# Patient Record
Sex: Female | Born: 1965 | Race: White | Hispanic: No | Marital: Married | Smoking: Current some day smoker
Health system: Southern US, Community
[De-identification: ages and names within clinical notes are randomized; demographics above are authoritative.]

---

## 1998-03-10 ENCOUNTER — Other Ambulatory Visit: Admission: RE | Admit: 1998-03-10 | Discharge: 1998-03-10 | Payer: Self-pay | Admitting: Obstetrics and Gynecology

## 2001-01-18 ENCOUNTER — Ambulatory Visit (HOSPITAL_COMMUNITY): Admission: RE | Admit: 2001-01-18 | Discharge: 2001-01-18 | Payer: Self-pay | Admitting: Obstetrics and Gynecology

## 2001-01-18 ENCOUNTER — Encounter: Payer: Self-pay | Admitting: Obstetrics and Gynecology

## 2001-06-13 ENCOUNTER — Inpatient Hospital Stay (HOSPITAL_COMMUNITY): Admission: AD | Admit: 2001-06-13 | Discharge: 2001-06-16 | Payer: Self-pay | Admitting: Obstetrics and Gynecology

## 2002-12-09 ENCOUNTER — Other Ambulatory Visit: Admission: RE | Admit: 2002-12-09 | Discharge: 2002-12-09 | Payer: Self-pay | Admitting: Obstetrics and Gynecology

## 2005-01-12 ENCOUNTER — Other Ambulatory Visit: Admission: RE | Admit: 2005-01-12 | Discharge: 2005-01-12 | Payer: Self-pay | Admitting: Obstetrics and Gynecology

## 2005-03-03 ENCOUNTER — Ambulatory Visit (HOSPITAL_COMMUNITY): Admission: RE | Admit: 2005-03-03 | Discharge: 2005-03-03 | Payer: Self-pay | Admitting: Obstetrics and Gynecology

## 2005-07-17 ENCOUNTER — Inpatient Hospital Stay (HOSPITAL_COMMUNITY): Admission: AD | Admit: 2005-07-17 | Discharge: 2005-07-19 | Payer: Self-pay | Admitting: Obstetrics and Gynecology

## 2006-02-07 ENCOUNTER — Other Ambulatory Visit: Admission: RE | Admit: 2006-02-07 | Discharge: 2006-02-07 | Payer: Self-pay | Admitting: Obstetrics and Gynecology

## 2010-02-23 ENCOUNTER — Encounter: Admission: RE | Admit: 2010-02-23 | Discharge: 2010-02-23 | Payer: Self-pay | Admitting: Family Medicine

## 2010-06-23 ENCOUNTER — Encounter: Admission: RE | Admit: 2010-06-23 | Discharge: 2010-06-23 | Payer: Self-pay | Admitting: Gynecology

## 2010-10-30 ENCOUNTER — Encounter: Payer: Self-pay | Admitting: Gynecology

## 2011-02-24 NOTE — Discharge Summary (Signed)
Mackenzie Wright, Mackenzie Wright                  ACCOUNT NO.:  [id number]   MEDICAL RECORD NO.:  [id number]          PATIENT TYPE:  INP   LOCATION:  9127                          FACILITY:  [facility]   PHYSICIAN:  Zenaida Niece, M.D.DATE OF BIRTH:  [birth year]   DATE OF ADMISSION:  07/17/2005  DATE OF DISCHARGE:                                 DISCHARGE SUMMARY   ADMISSION DIAGNOSIS:  Intrauterine pregnancy at 39 weeks.   DISCHARGE DIAGNOSES:  1.  Intrauterine pregnancy at 39 weeks.  2.  Shoulder dystocia.   PROCEDURES:  On July 17, 2005 she had a spontaneous vaginal delivery.   HISTORY AND PHYSICAL:  This is a 45 year old white female gravida 2 para 1-0-  0-1 with an EGA of [redacted] weeks who presents for elective induction. Prenatal  care complicated by advanced maternal age with a normal triple screen and a  normal ultrasound and was otherwise uncomplicated. Prenatal labs:  Blood  type is O positive with a negative antibody screen, RPR nonreactive, rubella  immune, hepatitis B surface antigen negative, gonorrhea and chlamydia  negative, 1-hour Glucola 106, and group B strep is negative. Past OB  history:  In 2002, vaginal delivery at 39 weeks, 7 pounds 9 ounces,  complicated by gestational hypertension. Physical exam:  She was afebrile  with stable vital signs. Fetal heart tracing reactive with irregular  contractions. Abdomen gravid and nontender with an estimated fetal weight of  8 pounds. Cervix was 3, 50, -1, vertex presentation, and amniotomy revealed  clear fluid.   HOSPITAL COURSE:  The patient was admitted and started on Pitocin for  induction. Amniotomy was then also performed for induction. She progressed  into labor and received an epidural. She progressed to complete, pushed  well, and on the evening of October 9 had a vaginal delivery of a viable  female infant with Apgars of 7 and 9 that weighed 8 pounds 11 ounces. There  was a moderate shoulder dystocia which resolved with McRoberts  maneuver,  suprapubic pressure, and woodscrew maneuver. Placenta delivered spontaneous  and was intact and was sent for cord blood collection. She had a second-  degree laceration repaired with 3-0 Vicryl. Estimated blood loss was 500 cc.  Postpartum, she had no significant complications. Predelivery hemoglobin  11.4, postdelivery 11.1, and on postpartum day #2 she was stable for  discharge home.   DISCHARGE INSTRUCTIONS:  Regular diet, pelvic rest, follow-up in 6 weeks.  Medications are Percocet #15 one p.o. q.6h. p.r.n. and over-the-counter  ibuprofen as needed. She is also given our discharge pamphlet.      Zenaida Niece, M.D.  Electronically Signed     TDM/MEDQ  D:  07/19/2005  T:  07/19/2005  Job:  [id number]

## 2011-02-24 NOTE — Discharge Summary (Signed)
[facility]  Patient:    ZANOVIA, ROTZ Visit Number: [id number] MRN: [id number]          Service Type: OBS Location: 910A 9121 01 Attending Physician:  Michaele Offer Dictated by:   Alvino Chapel, M.D. Admit Date:  06/13/2001 Discharge Date: 06/16/2001                             Discharge Summary  DISCHARGE DIAGNOSES:          1. Intrauterine pregnancy at 39 weeks,                                  delivered.                               2. Pregnancy induced hypertension.                               3. Status post normal spontaneous vaginal                                  delivery.  DISCHARGE MEDICATIONS:        1. Motrin 600 mg p.o. every six hours p.r.n.                               2. Percocet one to two tablets p.o. every four                                  hours p.r.n.  DISCHARGE FOLLOWUP:           The patient is to follow up in approximately six weeks for her routine postpartum exam.  HOSPITAL COURSE:              The patient is a 45 year old, G1, P0, who is admitted at 39 weeks by a 9-week ultrasound for induction given pregnancy induced hypertension. Blood pressures in the office were 130s-140s/90s. There were no symptoms. There were no abnormalities on the labs. However, given her favorable cervix and increased blood pressure, the patient was admitted for induction.  PRENATAL LABORATORY DATA:     O positive, antibody negative, RPR nonreactive, rubella immune, hepatitis B surface antigen negative, HIV negative, GC negative, Chlamydia negative, GBS negative.  Prenatal care was essentially uncomplicated except for the elevated blood pressure late in the third trimester. The patient had a history of depression which was treated effectively with Effexor.  GYNECOLOGIC HISTORY:           A colposcopy in 1991, normal Pap smear since then.  PAST MEDICAL HISTORY:         Depression.  PAST SURGICAL HISTORY:         Wisdom teeth.  ALLERGIES:                    No known drug allergies.  MEDICATIONS:                  Effexor 75 mg q.d.  SOCIAL HISTORY:  Married, nonsmoker.  PHYSICAL EXAMINATION:         On admission she was afebrile with stable vital signs. Blood pressures were 120s-140/80s-90s. Fetal heart rate was reactive. Estimated fetal weight was 7-1/2 pounds. Vaginal exam was 2-3 cm, 80% effaced and -1 station. She had ruptured membranes with clear fluid obtained and was begun on Pitocin. The patient progressed well and reached complete dilation. several hours later. She had a normal spontaneous vaginal delivery of a viable female infant. Apgars were 9 and 9 and weight was 7 pounds 9 ounces over a second-degree laceration. The placenta delivered spontaneously and second-degree laceration was repaired with 3-0 Vicryl with a local block.  The patient did very well postpartum and her blood pressures immediately normalized. On postpartum day #2 she was afebrile with stable vital signs and had no specific complaints; therefore, she was felt stable for discharge and was discharged to home to follow up in approximately six weeks.  Dictated by: Alvino Chapel, M.D. Attending Physician:  Michaele Offer DD:  06/16/01 TD:  06/16/01 Job: [id number]

## 2014-12-01 ENCOUNTER — Encounter (HOSPITAL_COMMUNITY): Payer: Self-pay | Admitting: Emergency Medicine

## 2014-12-01 ENCOUNTER — Emergency Department (HOSPITAL_COMMUNITY): Payer: 59

## 2014-12-01 ENCOUNTER — Emergency Department (HOSPITAL_COMMUNITY)
Admission: EM | Admit: 2014-12-01 | Discharge: 2014-12-01 | Disposition: A | Discharge status: Home / Self Care | Payer: 59 | Attending: Emergency Medicine | Admitting: Emergency Medicine

## 2014-12-01 DIAGNOSIS — Y9289 Other specified places as the place of occurrence of the external cause: Secondary | ICD-10-CM | POA: Diagnosis not present

## 2014-12-01 DIAGNOSIS — Y288XXA Contact with other sharp object, undetermined intent, initial encounter: Secondary | ICD-10-CM | POA: Diagnosis not present

## 2014-12-01 DIAGNOSIS — S61012A Laceration without foreign body of left thumb without damage to nail, initial encounter: Secondary | ICD-10-CM

## 2014-12-01 DIAGNOSIS — Z720 Tobacco use: Secondary | ICD-10-CM | POA: Insufficient documentation

## 2014-12-01 DIAGNOSIS — Y998 Other external cause status: Secondary | ICD-10-CM | POA: Insufficient documentation

## 2014-12-01 DIAGNOSIS — Y9389 Activity, other specified: Secondary | ICD-10-CM | POA: Diagnosis not present

## 2014-12-01 MED ORDER — LIDOCAINE-EPINEPHRINE 1 %-1:100000 IJ SOLN
10.0000 mL | Freq: Once | INTRAMUSCULAR | Status: AC
  Administered 2014-12-01: 10 mL
  Filled 2014-12-01: qty 1

## 2014-12-01 MED ORDER — CEPHALEXIN 500 MG PO CAPS
500.0000 mg | ORAL_CAPSULE | Freq: Four times a day (QID) | ORAL | Status: AC
Start: 2014-12-01 — End: ?

## 2014-12-01 NOTE — ED Notes (Addendum)
Left thumb ejaculating blood in a pulsating rhythm at approximately 80 pulses per minute. Pressure dressing applied to control hemorrhage with success. Skin flap separated from pain digit. Patient admits to some numbness in finger, although states this may be due to pressure dressing.

## 2014-12-01 NOTE — ED Provider Notes (Signed)
CSN: [id number]     Arrival date & time 12/01/14  1846 History   First MD Initiated Contact with Patient 12/01/14 1934     Chief Complaint  Patient presents with  . Extremity Laceration     (Consider location/radiation/quality/duration/timing/severity/associated sxs/prior Treatment) HPI    49 year old female with laceration to left thumb. Happened shortly before arrival. , on the edge of a metal can all opening it to make chili for dinner. No other injuries. May be some mild numbness in the tip of her thumb. She reports that her tetanus is up-to-date. No blood thinners.  History reviewed. No pertinent past medical history. History reviewed. No pertinent past surgical history. History reviewed. No pertinent family history. History  Substance Use Topics  . Smoking status: Current Some Day Smoker    Types: Cigarettes  . Smokeless tobacco: Not on file  . Alcohol Use: Yes     Comment: occasionally   OB History    No data available     Review of Systems  All systems reviewed and negative, other than as noted in HPI.   Allergies  Review of patient's allergies indicates no known allergies.  Home Medications   Prior to Admission medications   Medication Sig Start Date End Date Taking? Authorizing Provider  cephALEXin (KEFLEX) 500 MG capsule Take 1 capsule (500 mg total) by mouth 4 (four) times daily. 12/01/14   Raeford RazorStephen Orson Rho, MD   BP 154/95 mmHg  Pulse 75  Resp 16  SpO2 100%  LMP 11/30/2014 Physical Exam  Constitutional: She appears well-developed and well-nourished. No distress.  HENT:  Head: Normocephalic and atraumatic.  Eyes: Conjunctivae are normal. Right eye exhibits no discharge. Left eye exhibits no discharge.  Neck: Neck supple.  Cardiovascular: Normal rate, regular rhythm and normal heart sounds.  Exam reveals no gallop and no friction rub.   No murmur heard. Pulmonary/Chest: Effort normal and breath sounds normal. No respiratory distress.  Abdominal: Soft.  She exhibits no distension. There is no tenderness.  Musculoskeletal: She exhibits no edema or tenderness.  Neurological: She is alert.  Skin: Skin is warm and dry.   Lacerations the left thumb. Palmar aspect. Flapped "V" shaped laceration over the distal thumb with point of "V" pointing distally.  Additional laceration starting at about the interphalangeal previous extending proximally the radial side of the thumb. Brisk, but nonpulsatile bleeding. Able to actively flex and extend against resistance.  Psychiatric: She has a normal mood and affect. Her behavior is normal. Thought content normal.  Nursing note and vitals reviewed.   ED Course  Procedures (including critical care time)  LACERATION REPAIR Performed by: Raeford RazorKOHUT, Ajee Heasley Authorized by: Raeford RazorKOHUT, Alisson Rozell Consent: Verbal consent obtained. Risks and benefits: risks, benefits and alternatives were discussed Consent given by: patient Patient identity confirmed: provided demographic data Prepped and Draped in normal sterile fashion Wound explored  Laceration Location: thumb  Laceration Length: 5 cm  No Foreign Bodies seen or palpated  Anesthesia: local infiltration  Local anesthetic: lidocaine 1% w epinephrine  Anesthetic total: 4 ml  Irrigation method: syringe Amount of cleaning: standard  Skin closure: simple interrupted, 4-0 mono  Number of sutures: 13  Patient tolerance: Patient tolerated the procedure well with no immediate complications.  Labs Review Labs Reviewed - No data to display  Imaging Review Dg Finger Thumb Left  12/01/2014   CLINICAL DATA:  Pain and laceration to the palm are surface of the proximal thumb. Cut thumb on a can lid. Injury 1 hr ago.  EXAM: LEFT THUMB 2+V  COMPARISON:  None.  FINDINGS: Degenerative changes in the interphalangeal joints of the left first finger. Old ununited ossicle along the dorsal plate likely representing old injury or ligamentous calcification. No evidence of acute  fracture or dislocation. Degenerative changes in the first carpometacarpal joint. No radiopaque soft tissue foreign bodies.  IMPRESSION: No acute bony abnormalities. No radiopaque soft tissue foreign bodies.   Electronically Signed   By: Burman Nieves M.D.   On: 12/01/2014 20:35     EKG Interpretation None      MDM   Final diagnoses:  Thumb laceration, left, initial encounter    49yF with lacerations to R thumb. Questionable decreased sensation along radial aspect distal thumb. Distal aspect of flap towards tip of thumb is very thin. Sutured down, but questionable vascularity of this area. Flexor tendon apparatus not visualized. Able to flex against resistance. Continued wound care discussed and return precautions. Hand surgical follow-up if she feels has decreased sensation after anesthetic wears off. Follow-up otherwise with PCP for suture removal. Tetanus current.     Raeford Razor, MD 12/09/14 1012

## 2014-12-01 NOTE — Discharge Instructions (Signed)

## 2014-12-01 NOTE — ED Notes (Signed)
Kohut, MD, made aware of patient's pulsating bleed and increasing numbness.

## 2017-02-06 DIAGNOSIS — R109 Unspecified abdominal pain: Secondary | ICD-10-CM | POA: Diagnosis not present

## 2017-02-06 DIAGNOSIS — R399 Unspecified symptoms and signs involving the genitourinary system: Secondary | ICD-10-CM | POA: Diagnosis not present

## 2017-02-07 ENCOUNTER — Other Ambulatory Visit: Payer: Self-pay | Admitting: Family Medicine

## 2017-02-07 DIAGNOSIS — R109 Unspecified abdominal pain: Secondary | ICD-10-CM

## 2017-02-08 ENCOUNTER — Other Ambulatory Visit: Payer: Self-pay

## 2018-04-24 DIAGNOSIS — R030 Elevated blood-pressure reading, without diagnosis of hypertension: Secondary | ICD-10-CM | POA: Diagnosis not present

## 2018-09-30 DIAGNOSIS — E7800 Pure hypercholesterolemia, unspecified: Secondary | ICD-10-CM | POA: Diagnosis not present

## 2018-09-30 DIAGNOSIS — Z1211 Encounter for screening for malignant neoplasm of colon: Secondary | ICD-10-CM | POA: Diagnosis not present

## 2018-09-30 DIAGNOSIS — Z23 Encounter for immunization: Secondary | ICD-10-CM | POA: Diagnosis not present

## 2019-11-25 ENCOUNTER — Other Ambulatory Visit: Payer: Self-pay | Admitting: Family Medicine

## 2019-11-25 DIAGNOSIS — N6320 Unspecified lump in the left breast, unspecified quadrant: Secondary | ICD-10-CM

## 2019-12-08 ENCOUNTER — Ambulatory Visit
Admission: RE | Admit: 2019-12-08 | Discharge: 2019-12-08 | Disposition: A | Discharge status: Home / Self Care | Payer: 59 | Source: Ambulatory Visit | Attending: Family Medicine | Admitting: Family Medicine

## 2019-12-08 ENCOUNTER — Other Ambulatory Visit: Payer: Self-pay

## 2019-12-08 DIAGNOSIS — N6320 Unspecified lump in the left breast, unspecified quadrant: Secondary | ICD-10-CM

## 2021-10-19 IMAGING — MG DIGITAL DIAGNOSTIC BILAT W/ TOMO W/ CAD
6 of 10 series · 6 of 30 positions shown · non-contrast
Comparison: Previous exam(s).

CLINICAL DATA: 54-year-old female with a palpable area of concern
in the left breast.

EXAM:
DIGITAL DIAGNOSTIC BILATERAL MAMMOGRAM WITH CAD AND TOMO
LEFT BREAST ULTRASOUND

[R CC synth-2D]
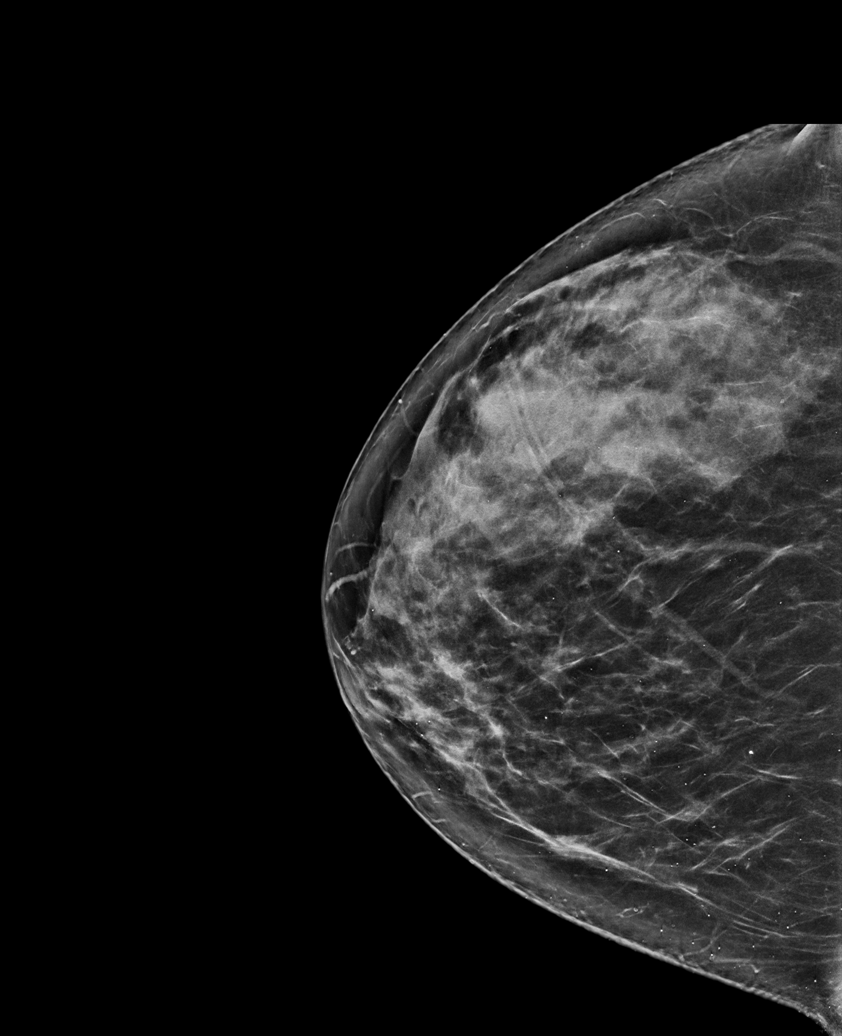

[L MLO synth-2D]
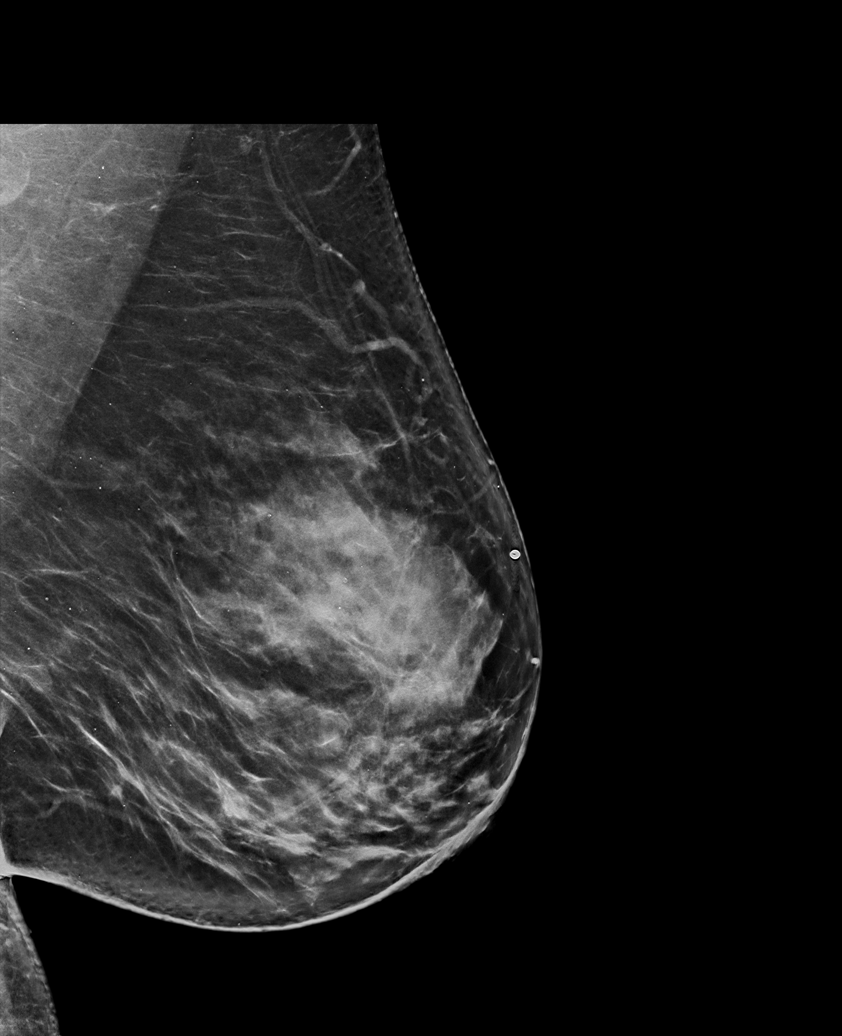

[R MLO synth-2D]
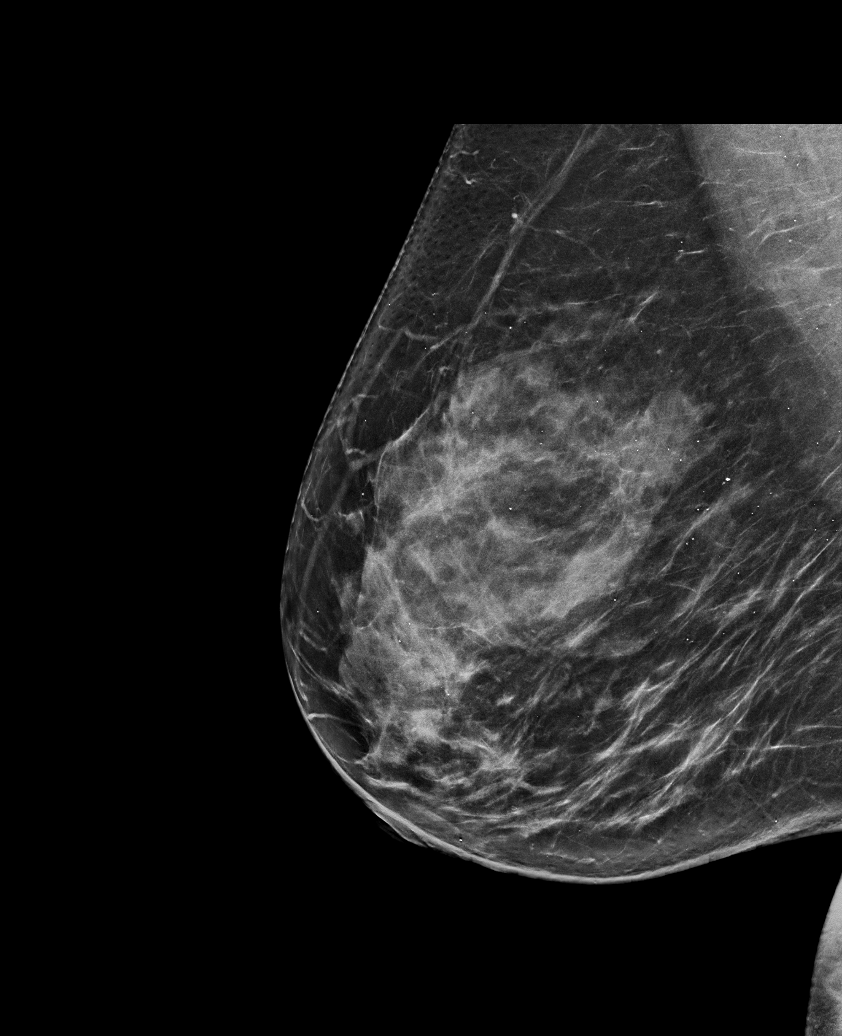

[L CC synth-2D]
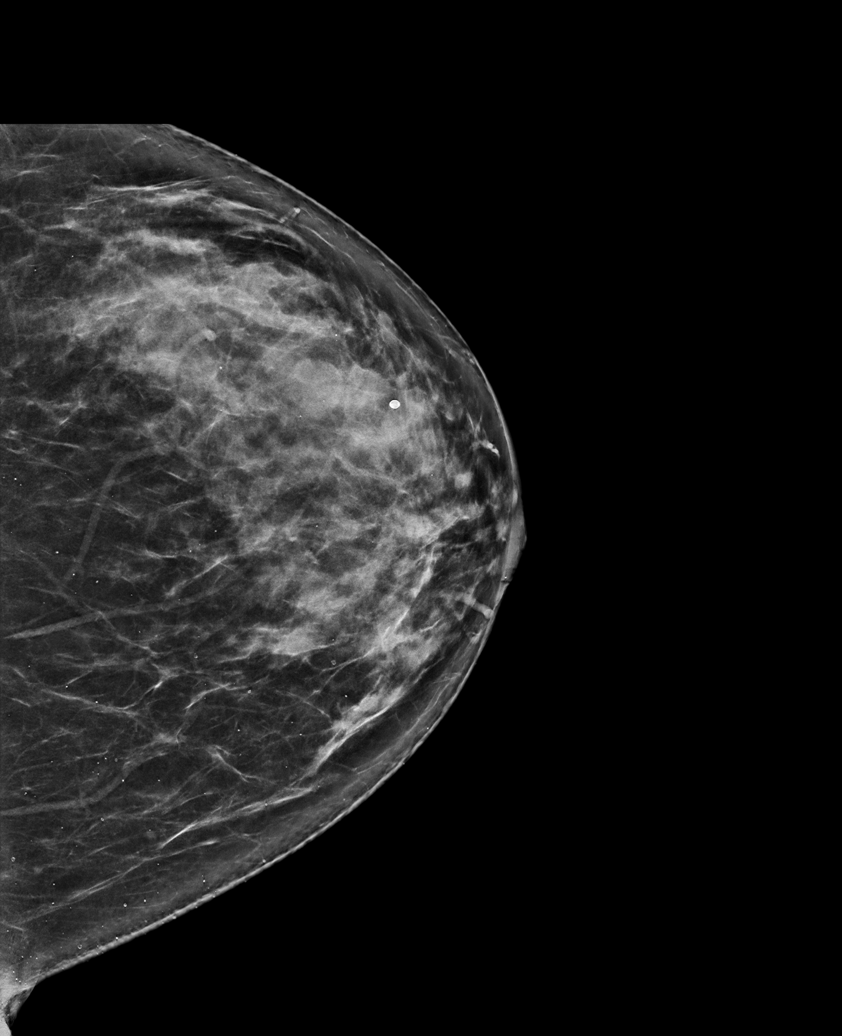

[L TAN synth-2D]
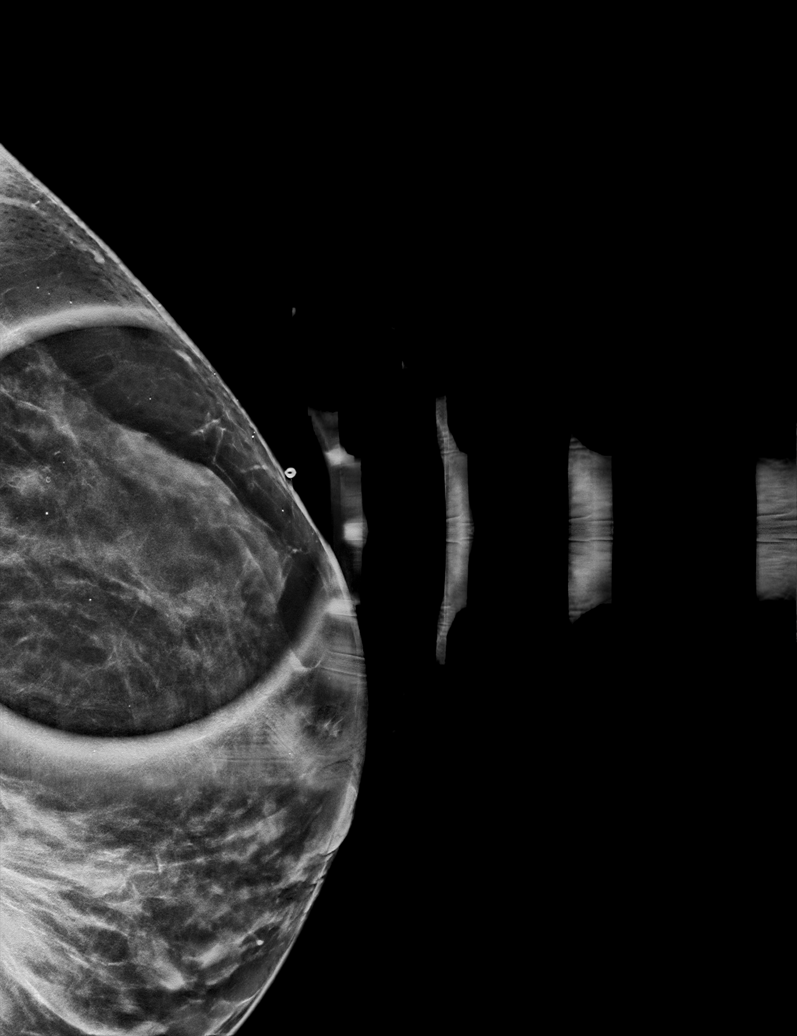

[R CC tomo · tomo slice 44/87.0]
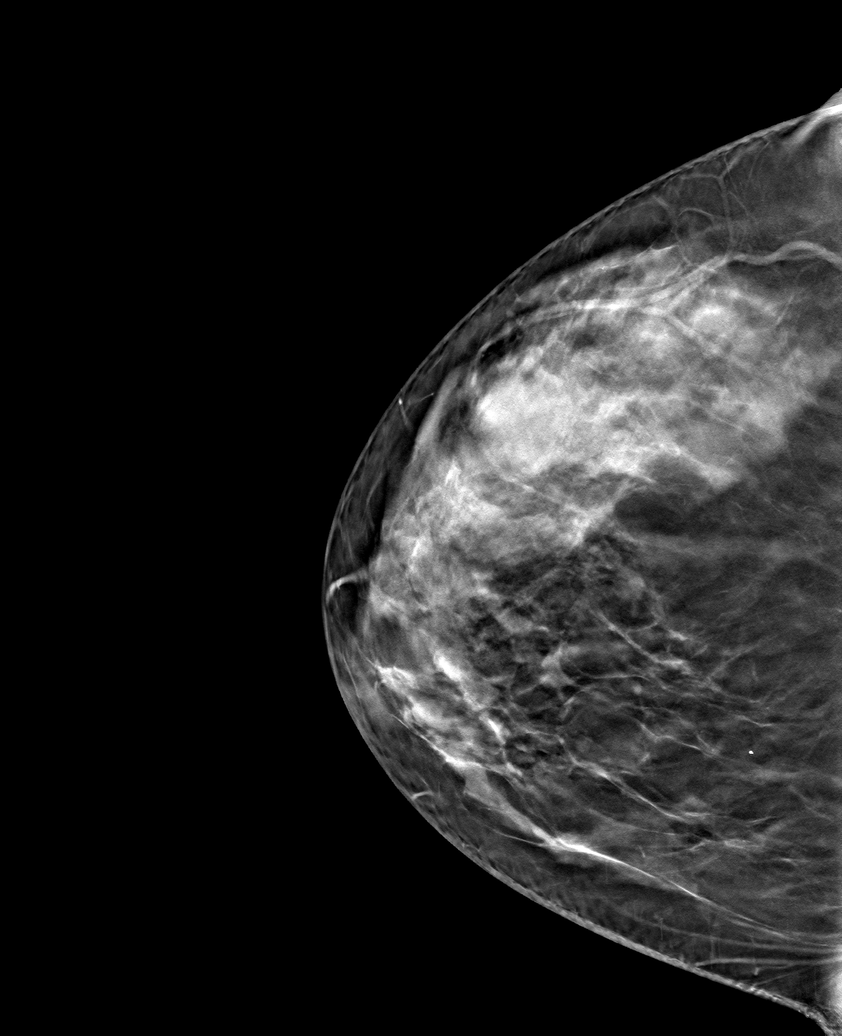

[6 of 30 positions shown; findings below may reference images not displayed]

ACR Breast Density Category c: The breast tissue is heterogeneously
dense, which may obscure small masses.
FINDINGS: No suspicious masses or calcifications are seen in the right breast.
There is an oval mass with obscured margins in the region of
palpable concern, obscured by dense fibroglandular tissue.

Mammographic images were processed with CAD.

Physical examination at the site of palpable concern within the
upper left breast reveals a firm slightly mobile nodule.

Targeted ultrasound of the left breast was performed. There is a
cyst at the [DATE] position 3 cm from nipple measuring 2 x 1.1 x
cm. This is felt to correspond with the mass seen in the left breast
at mammography. An adjacent smaller cyst measures 0.7 cm.
IMPRESSION: Left breast cyst.  No findings of malignancy in either breast.

RECOMMENDATION:
Screening mammogram in one year.(Code:MO-8-MUJ)

I have discussed the findings and recommendations with the patient.
If applicable, a reminder letter will be sent to the patient
regarding the next appointment.

BI-RADS CATEGORY  2: Benign.

## 2021-10-19 IMAGING — US US BREAST*L* LIMITED INC AXILLA
1 series · 8 of 8 positions shown · non-contrast
Comparison: Previous exam(s).

CLINICAL DATA: 54-year-old female with a palpable area of concern
in the left breast.

EXAM:
DIGITAL DIAGNOSTIC BILATERAL MAMMOGRAM WITH CAD AND TOMO
LEFT BREAST ULTRASOUND

[Series 1: us breast*left* limited inc axilla · 0.07mm/px · 8 of 8 slices shown]
[im 1/8]
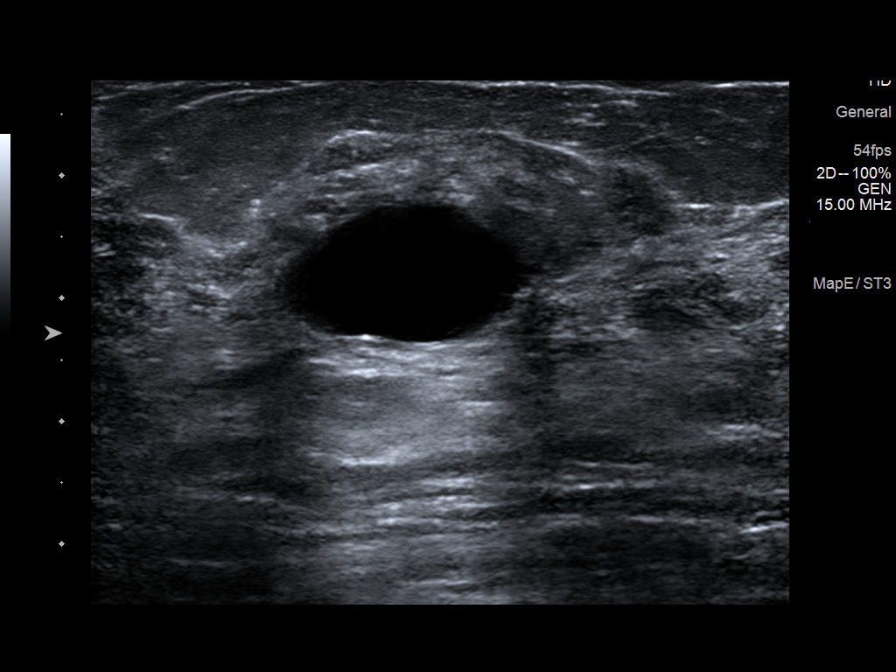
[im 2/8]
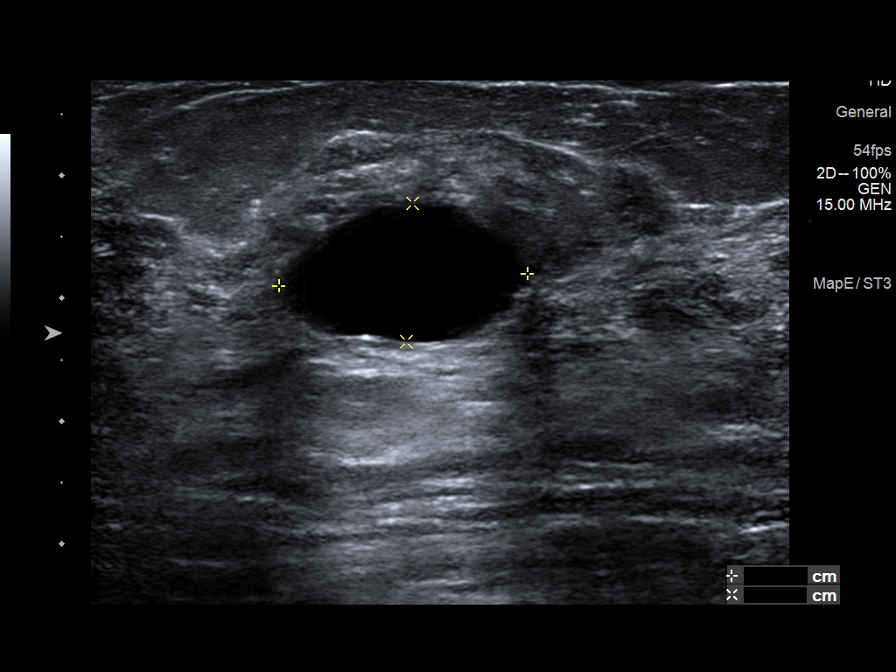
[im 3/8]
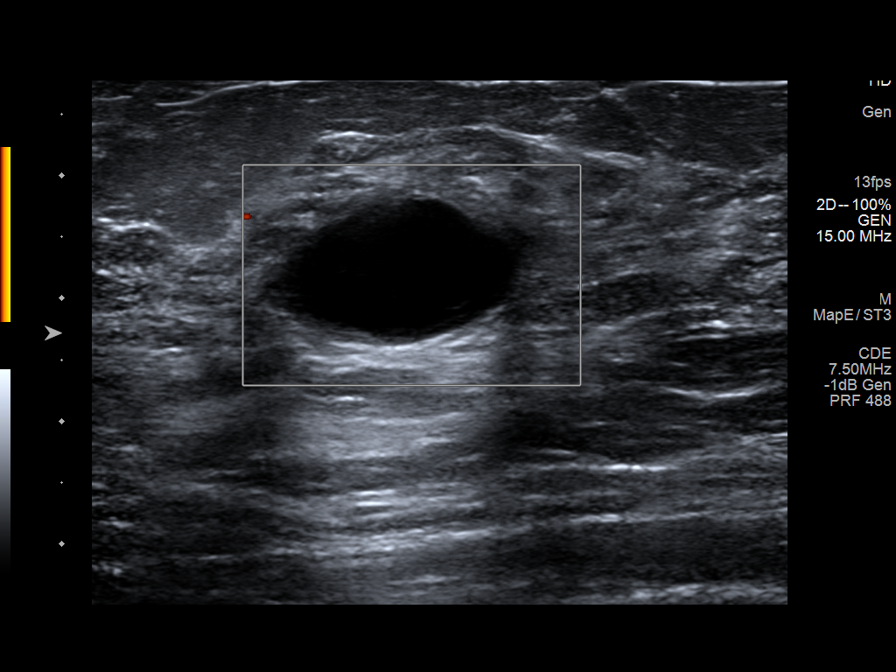
[im 4/8]
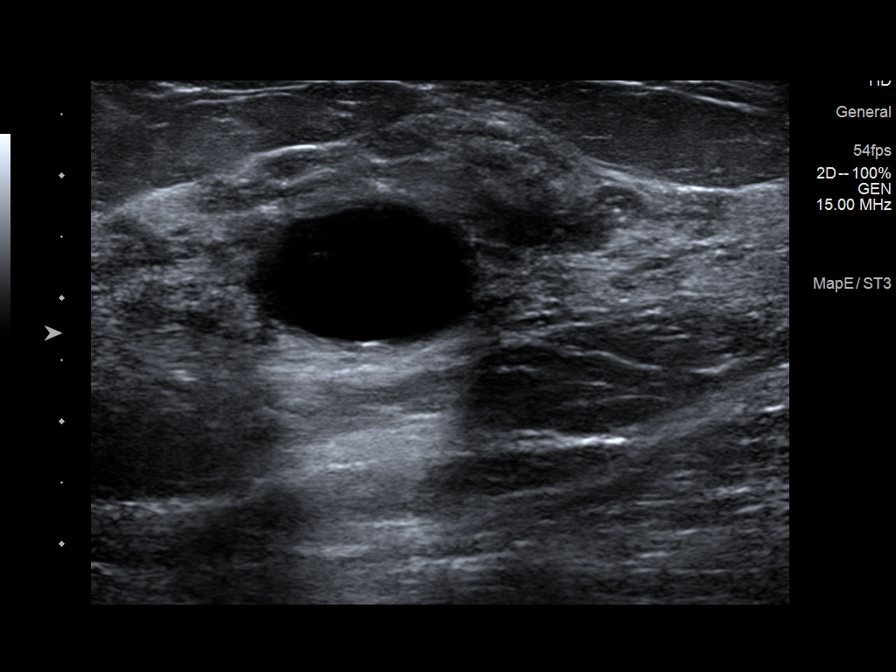
[im 5/8]
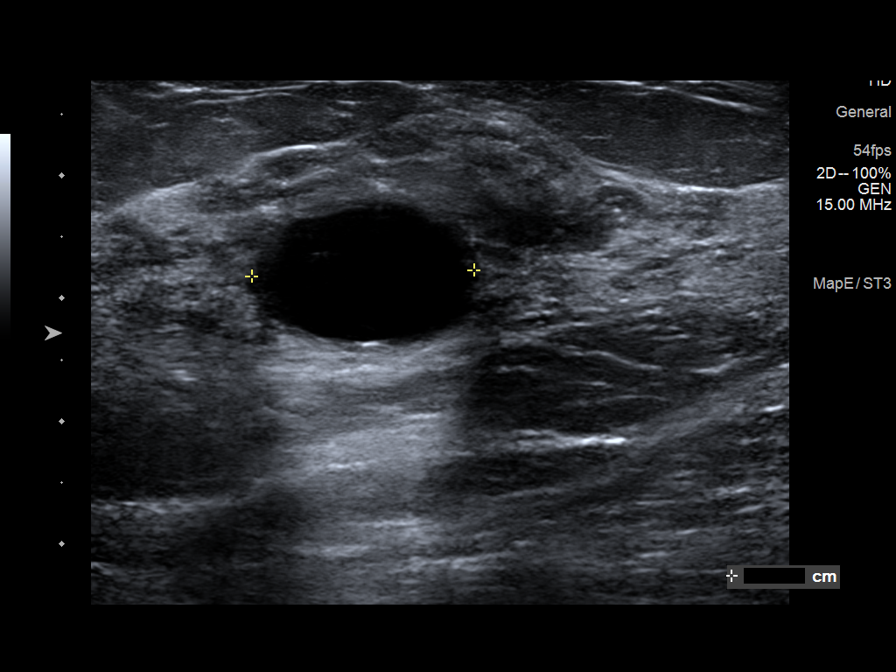
[im 6/8]
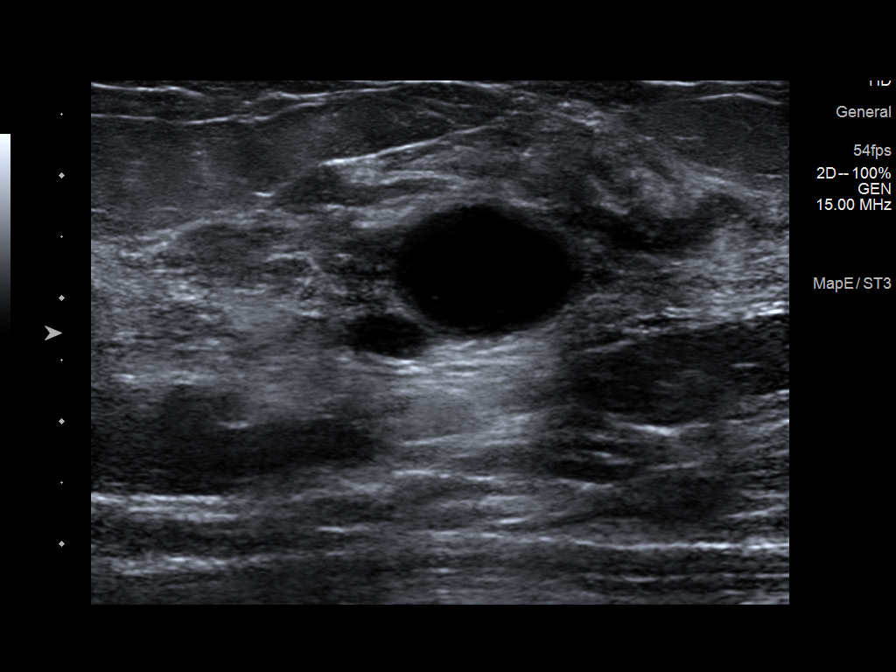
[im 7/8]
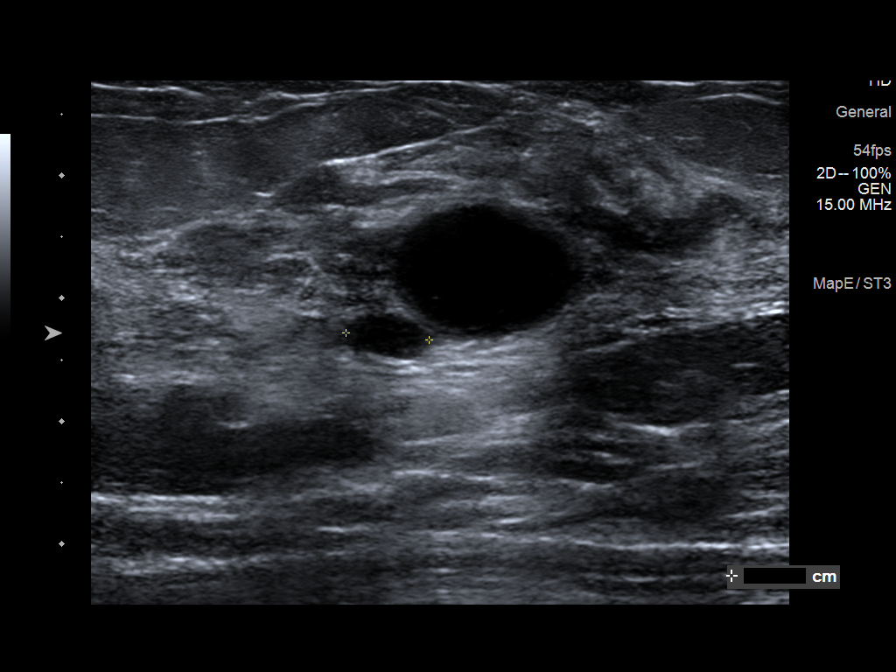
[im 8/8]
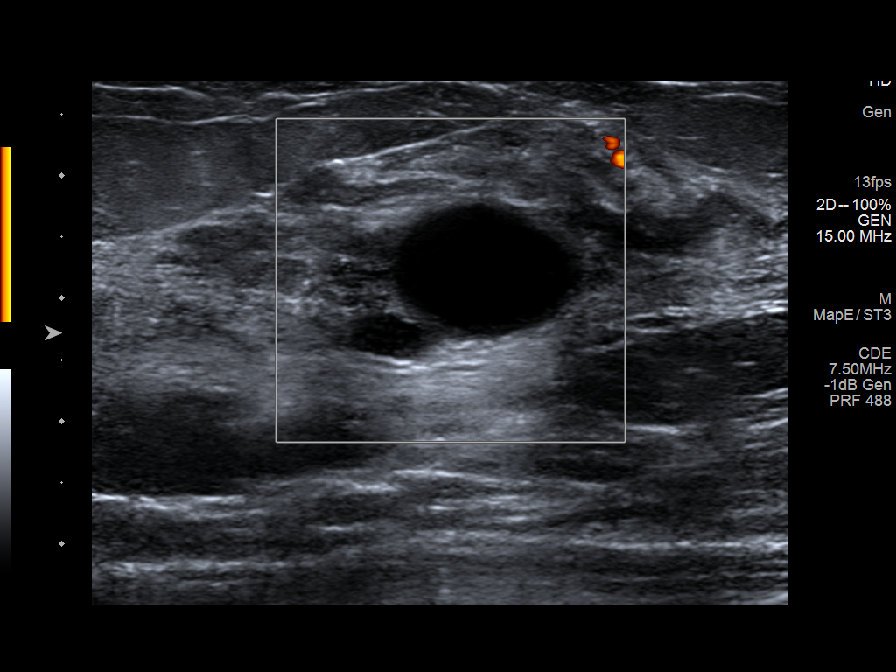

[8 of 8 positions shown; findings below may reference images not displayed]

ACR Breast Density Category c: The breast tissue is heterogeneously
dense, which may obscure small masses.
FINDINGS: No suspicious masses or calcifications are seen in the right breast.
There is an oval mass with obscured margins in the region of
palpable concern, obscured by dense fibroglandular tissue.

Mammographic images were processed with CAD.

Physical examination at the site of palpable concern within the
upper left breast reveals a firm slightly mobile nodule.

Targeted ultrasound of the left breast was performed. There is a
cyst at the [DATE] position 3 cm from nipple measuring 2 x 1.1 x
cm. This is felt to correspond with the mass seen in the left breast
at mammography. An adjacent smaller cyst measures 0.7 cm.
IMPRESSION: Left breast cyst.  No findings of malignancy in either breast.

RECOMMENDATION:
Screening mammogram in one year.(Code:MO-8-MUJ)

I have discussed the findings and recommendations with the patient.
If applicable, a reminder letter will be sent to the patient
regarding the next appointment.

BI-RADS CATEGORY  2: Benign.
